# Patient Record
Sex: Male | Born: 1975 | Race: Black or African American | Hispanic: No | Marital: Married | Smoking: Never smoker
Health system: Southern US, Community
[De-identification: ages and names within clinical notes are randomized; demographics above are authoritative.]

## PROBLEM LIST (undated history)

## (undated) DIAGNOSIS — I10 Essential (primary) hypertension: Secondary | ICD-10-CM

## (undated) HISTORY — PX: ANKLE RECONSTRUCTION: SHX1151

---

## 2011-05-04 ENCOUNTER — Encounter: Payer: Self-pay | Admitting: *Deleted

## 2011-05-04 ENCOUNTER — Emergency Department (HOSPITAL_BASED_OUTPATIENT_CLINIC_OR_DEPARTMENT_OTHER)
Admission: EM | Admit: 2011-05-04 | Discharge: 2011-05-04 | Disposition: A | Discharge status: Home / Self Care | Payer: No Typology Code available for payment source | Attending: Emergency Medicine | Admitting: Emergency Medicine

## 2011-05-04 DIAGNOSIS — J45909 Unspecified asthma, uncomplicated: Secondary | ICD-10-CM | POA: Insufficient documentation

## 2011-05-04 DIAGNOSIS — R0602 Shortness of breath: Secondary | ICD-10-CM | POA: Insufficient documentation

## 2011-05-04 DIAGNOSIS — Y92410 Unspecified street and highway as the place of occurrence of the external cause: Secondary | ICD-10-CM | POA: Insufficient documentation

## 2011-05-04 DIAGNOSIS — IMO0002 Reserved for concepts with insufficient information to code with codable children: Secondary | ICD-10-CM | POA: Insufficient documentation

## 2011-05-04 DIAGNOSIS — T148XXA Other injury of unspecified body region, initial encounter: Secondary | ICD-10-CM

## 2011-05-04 MED ORDER — HYDROCODONE-ACETAMINOPHEN 5-325 MG PO TABS: 2.0000 | ORAL_TABLET | ORAL | Status: AC | PRN

## 2011-05-04 MED ORDER — IBUPROFEN 800 MG PO TABS: 800.0000 mg | ORAL_TABLET | Freq: Three times a day (TID) | ORAL | Status: AC

## 2011-05-04 NOTE — ED Provider Notes (Signed)
Medical screening examination/treatment/procedure(s) were performed by non-physician practitioner and as supervising physician I was immediately available for consultation/collaboration.   Andrew King, MD 05/04/11 1329 

## 2011-05-04 NOTE — ED Notes (Signed)
Karen Sofia, PA-C at bedside for evaluation. 
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
## 2018-05-09 ENCOUNTER — Encounter (HOSPITAL_BASED_OUTPATIENT_CLINIC_OR_DEPARTMENT_OTHER): Payer: Self-pay

## 2018-05-09 ENCOUNTER — Other Ambulatory Visit: Payer: Self-pay

## 2018-05-09 ENCOUNTER — Emergency Department (HOSPITAL_BASED_OUTPATIENT_CLINIC_OR_DEPARTMENT_OTHER)
Admission: EM | Admit: 2018-05-09 | Discharge: 2018-05-09 | Disposition: A | Discharge status: Home / Self Care | Payer: Self-pay | Attending: Emergency Medicine | Admitting: Emergency Medicine

## 2018-05-09 DIAGNOSIS — J45909 Unspecified asthma, uncomplicated: Secondary | ICD-10-CM | POA: Insufficient documentation

## 2018-05-09 DIAGNOSIS — Z79899 Other long term (current) drug therapy: Secondary | ICD-10-CM | POA: Insufficient documentation

## 2018-05-09 DIAGNOSIS — Y939 Activity, unspecified: Secondary | ICD-10-CM | POA: Insufficient documentation

## 2018-05-09 DIAGNOSIS — S40862A Insect bite (nonvenomous) of left upper arm, initial encounter: Secondary | ICD-10-CM | POA: Insufficient documentation

## 2018-05-09 DIAGNOSIS — R21 Rash and other nonspecific skin eruption: Secondary | ICD-10-CM | POA: Insufficient documentation

## 2018-05-09 DIAGNOSIS — Y929 Unspecified place or not applicable: Secondary | ICD-10-CM | POA: Insufficient documentation

## 2018-05-09 DIAGNOSIS — Y999 Unspecified external cause status: Secondary | ICD-10-CM | POA: Insufficient documentation

## 2018-05-09 DIAGNOSIS — S40869A Insect bite (nonvenomous) of unspecified upper arm, initial encounter: Secondary | ICD-10-CM

## 2018-05-09 DIAGNOSIS — S40811A Abrasion of right upper arm, initial encounter: Secondary | ICD-10-CM | POA: Insufficient documentation

## 2018-05-09 DIAGNOSIS — X58XXXA Exposure to other specified factors, initial encounter: Secondary | ICD-10-CM | POA: Insufficient documentation

## 2018-05-09 DIAGNOSIS — W57XXXA Bitten or stung by nonvenomous insect and other nonvenomous arthropods, initial encounter: Secondary | ICD-10-CM | POA: Insufficient documentation

## 2018-05-09 MED ORDER — DIPHENHYDRAMINE HCL 25 MG PO TABS: 25.0000 mg | ORAL_TABLET | Freq: Four times a day (QID) | ORAL | 0 refills | Status: DC | PRN

## 2018-05-09 MED ORDER — DIPHENHYDRAMINE HCL 25 MG PO CAPS
25.0000 mg | ORAL_CAPSULE | ORAL | Status: AC
  Administered 2018-05-09: 25 mg via ORAL
  Filled 2018-05-09: qty 1

## 2018-05-09 NOTE — ED Provider Notes (Signed)
[facility] EMERGENCY DEPARTMENT Provider Note   CSN: [id number] Arrival date & time: 05/09/18  0246     History   Chief Complaint Chief Complaint  Patient presents with  . Bites    HPI Leroy Hoffman is a 43 y.o. male.  The history is provided by the patient.  Rash  Location:  Torso and shoulder/arm Shoulder/arm rash location:  L forearm and R forearm Torso rash location:  Upper back Quality: itchiness   Quality: not blistering, not draining, not painful, not peeling and not red   Severity:  Mild Onset quality:  Sudden Timing:  Constant Progression:  Unchanged Chronicity:  New Context: insect bite/sting   Relieved by:  Nothing Worsened by:  Nothing Ineffective treatments:  None tried Associated symptoms: no abdominal pain, no fever, no induration and no shortness of breath   Would like to know if the 5 bites he has are bug bed bites or some other insect.  Had the bites a few weeks ago and they got a new bed etc.    Past Medical History:  Diagnosis Date  . Asthma     There are no active problems to display for this patient.   Past Surgical History:  Procedure Laterality Date  . ANKLE RECONSTRUCTION          Home Medications    Prior to Admission medications   Medication Sig Start Date End Date Taking? Authorizing Provider  albuterol (PROVENTIL HFA;VENTOLIN HFA) 108 (90 Base) MCG/ACT inhaler Inhale 1-2 puffs into the lungs every 6 (six) hours as needed for wheezing or shortness of breath.   Yes [provider]  atorvastatin (LIPITOR) 40 MG tablet Take 40 mg by mouth daily.   Yes [provider]  albuterol (PROVENTIL) (2.5 MG/3ML) 0.083% nebulizer solution Take 2.5 mg by nebulization every 6 (six) hours as needed.      [provider]  diphenhydrAMINE (BENADRYL) 25 MG tablet Take 1 tablet (25 mg total) by mouth every 6 (six) hours as needed. 05/09/18   Liliauna Santoni, MD  ipratropium (ATROVENT) 0.02 % nebulizer  solution Take 500 mcg by nebulization 4 (four) times daily.      [provider]    Family History No family history on file.  Social History Social History   Tobacco Use  . Smoking status: Never Smoker  . Smokeless tobacco: Never Used  Substance Use Topics  . Alcohol use: Yes    Comment: occ  . Drug use: Yes    Types: Marijuana     Allergies   Onion   Review of Systems Review of Systems  Constitutional: Negative for fever.  HENT: Negative for facial swelling.   Respiratory: Negative for shortness of breath.   Cardiovascular: Negative for chest pain.  Gastrointestinal: Negative for abdominal pain.  Genitourinary: Negative for hematuria.  Skin: Positive for rash.  All other systems reviewed and are negative.    Physical Exam Updated Vital Signs BP (!) 171/107 (BP Location: Left Arm)   Pulse 90   Temp 98.5 F (36.9 C) (Oral)   Resp 18   Ht 6' (1.829 m)   Wt 120.2 kg   SpO2 98%   BMI 35.94 kg/m   Physical Exam Constitutional:      Appearance: Normal appearance.  HENT:     Head: Normocephalic and atraumatic.     Nose: Nose normal.     Mouth/Throat:     Mouth: Mucous membranes are moist.  Eyes:  Pupils: Pupils are equal, round, and reactive to light.  Neck:     Musculoskeletal: Normal range of motion and neck supple.  Cardiovascular:     Rate and Rhythm: Normal rate and regular rhythm.     Pulses: Normal pulses.     Heart sounds: Normal heart sounds.  Pulmonary:     Effort: Pulmonary effort is normal.     Breath sounds: Normal breath sounds.  Abdominal:     General: Abdomen is flat. Bowel sounds are normal.     Palpations: Abdomen is soft.     Tenderness: There is no abdominal tenderness.  Musculoskeletal: Normal range of motion.  Skin:    General: Skin is warm and dry.     Capillary Refill: Capillary refill takes less than 2 seconds.     Comments: 5 wheals with central envenomation  Neurological:     General: No focal deficit  present.     Mental Status: He is alert and oriented to person, place, and time.  Psychiatric:        Mood and Affect: Mood normal.        Behavior: Behavior normal.      ED Treatments / Results  Labs (all labs ordered are listed, but only abnormal results are displayed) Labs Reviewed - No data to display  EKG None  Radiology No results found.  Procedures Procedures (including critical care time)  Medications Ordered in ED Medications  diphenhydrAMINE (BENADRYL) capsule 25 mg (has no administration in time range)       Final Clinical Impressions(s) / ED Diagnoses   Final diagnoses:  Insect bite of upper arm, unspecified laterality, initial encounter  Reaction to insect bite  Return for pain, intractable cough, productive cough,fevers >100.4 unrelieved by medication, shortness of breath, intractable vomiting, or diarrhea, abdominal pain, Inability to tolerate liquids or food, cough, altered mental status or any concerns. No signs of systemic illness or infection. The patient is nontoxic-appearing on exam and vital signs are within normal limits.   I have reviewed the triage vital signs and the nursing notes. Pertinent labs &imaging results that were available during my care of the patient were reviewed by me and considered in my medical decision making (see chart for details).  After history, exam, and medical workup I feel the patient has been appropriately medically screened and is safe for discharge home. Pertinent diagnoses were discussed with the patient. Patient was given return precautions.  ED Discharge Orders         Ordered    diphenhydrAMINE (BENADRYL) 25 MG tablet  Every 6 hours PRN     05/09/18 0328           Fidencia Mccloud, MD 05/09/18 0401

## 2018-05-09 NOTE — ED Triage Notes (Signed)
Patient c/o bites of unknown origin on his face, back and BUE X 2 to 3 weeks.

## 2018-05-22 ENCOUNTER — Emergency Department (HOSPITAL_BASED_OUTPATIENT_CLINIC_OR_DEPARTMENT_OTHER): Payer: No Typology Code available for payment source

## 2018-05-22 ENCOUNTER — Encounter (HOSPITAL_BASED_OUTPATIENT_CLINIC_OR_DEPARTMENT_OTHER): Payer: Self-pay | Admitting: Emergency Medicine

## 2018-05-22 ENCOUNTER — Other Ambulatory Visit: Payer: Self-pay

## 2018-05-22 ENCOUNTER — Emergency Department (HOSPITAL_BASED_OUTPATIENT_CLINIC_OR_DEPARTMENT_OTHER)
Admission: EM | Admit: 2018-05-22 | Discharge: 2018-05-22 | Disposition: A | Discharge status: Home / Self Care | Payer: No Typology Code available for payment source | Attending: Emergency Medicine | Admitting: Emergency Medicine

## 2018-05-22 DIAGNOSIS — I10 Essential (primary) hypertension: Secondary | ICD-10-CM | POA: Insufficient documentation

## 2018-05-22 DIAGNOSIS — Y999 Unspecified external cause status: Secondary | ICD-10-CM | POA: Diagnosis not present

## 2018-05-22 DIAGNOSIS — R0789 Other chest pain: Secondary | ICD-10-CM | POA: Insufficient documentation

## 2018-05-22 DIAGNOSIS — J45909 Unspecified asthma, uncomplicated: Secondary | ICD-10-CM | POA: Insufficient documentation

## 2018-05-22 DIAGNOSIS — Y939 Activity, unspecified: Secondary | ICD-10-CM | POA: Diagnosis not present

## 2018-05-22 DIAGNOSIS — Y929 Unspecified place or not applicable: Secondary | ICD-10-CM | POA: Insufficient documentation

## 2018-05-22 DIAGNOSIS — Z79899 Other long term (current) drug therapy: Secondary | ICD-10-CM | POA: Diagnosis not present

## 2018-05-22 DIAGNOSIS — S299XXA Unspecified injury of thorax, initial encounter: Secondary | ICD-10-CM | POA: Diagnosis present

## 2018-05-22 HISTORY — DX: Essential (primary) hypertension: I10

## 2018-05-22 MED ORDER — CYCLOBENZAPRINE HCL 10 MG PO TABS: 10.0000 mg | ORAL_TABLET | Freq: Two times a day (BID) | ORAL | 0 refills | Status: DC | PRN
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
## 2018-05-22 NOTE — Discharge Instructions (Signed)
Your x-ray today showed no evidence of rib fracture or significant chest injury.  We suspect you have soft tissue and muscular injury.  Please use the muscle relaxants help with your discomfort and follow-up with your primary doctor.  Please follow-up with a sports medicine physician if your symptoms continue.  If any symptoms change or worsen, please return to the nearest emergency department.
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
## 2018-07-03 ENCOUNTER — Emergency Department (HOSPITAL_BASED_OUTPATIENT_CLINIC_OR_DEPARTMENT_OTHER)
Admission: EM | Admit: 2018-07-03 | Discharge: 2018-07-03 | Disposition: A | Discharge status: Home / Self Care | Payer: Self-pay | Attending: Emergency Medicine | Admitting: Emergency Medicine

## 2018-07-03 ENCOUNTER — Emergency Department (HOSPITAL_BASED_OUTPATIENT_CLINIC_OR_DEPARTMENT_OTHER): Payer: Self-pay

## 2018-07-03 ENCOUNTER — Other Ambulatory Visit: Payer: Self-pay

## 2018-07-03 ENCOUNTER — Encounter (HOSPITAL_BASED_OUTPATIENT_CLINIC_OR_DEPARTMENT_OTHER): Payer: Self-pay | Admitting: Emergency Medicine

## 2018-07-03 DIAGNOSIS — J4541 Moderate persistent asthma with (acute) exacerbation: Secondary | ICD-10-CM | POA: Insufficient documentation

## 2018-07-03 DIAGNOSIS — R05 Cough: Secondary | ICD-10-CM | POA: Insufficient documentation

## 2018-07-03 DIAGNOSIS — I10 Essential (primary) hypertension: Secondary | ICD-10-CM | POA: Insufficient documentation

## 2018-07-03 DIAGNOSIS — Z79899 Other long term (current) drug therapy: Secondary | ICD-10-CM | POA: Insufficient documentation

## 2018-07-03 DIAGNOSIS — F1210 Cannabis abuse, uncomplicated: Secondary | ICD-10-CM | POA: Insufficient documentation

## 2018-07-03 MED ORDER — PREDNISONE 10 MG (21) PO TBPK: ORAL_TABLET | Freq: Every day | ORAL | 0 refills | Status: DC

## 2018-07-03 MED ORDER — PREDNISONE 50 MG PO TABS
60.0000 mg | ORAL_TABLET | Freq: Once | ORAL | Status: AC
  Administered 2018-07-03: 60 mg via ORAL
  Filled 2018-07-03: qty 1

## 2018-07-03 MED ORDER — ALBUTEROL SULFATE HFA 108 (90 BASE) MCG/ACT IN AERS: 1.0000 | INHALATION_SPRAY | Freq: Four times a day (QID) | RESPIRATORY_TRACT | 0 refills | Status: DC | PRN

## 2018-07-03 MED ORDER — ALBUTEROL SULFATE (2.5 MG/3ML) 0.083% IN NEBU
5.0000 mg | INHALATION_SOLUTION | Freq: Once | RESPIRATORY_TRACT | Status: AC
  Administered 2018-07-03: 5 mg via RESPIRATORY_TRACT
  Filled 2018-07-03: qty 6

## 2018-07-03 MED ORDER — IPRATROPIUM BROMIDE 0.02 % IN SOLN
0.5000 mg | Freq: Once | RESPIRATORY_TRACT | Status: AC
  Administered 2018-07-03: 0.5 mg via RESPIRATORY_TRACT
  Filled 2018-07-03: qty 2.5

## 2018-07-03 MED ORDER — HYDROCHLOROTHIAZIDE 25 MG PO TABS: 25.0000 mg | ORAL_TABLET | Freq: Every day | ORAL | 0 refills | Status: DC

## 2018-07-03 MED ORDER — ALBUTEROL (5 MG/ML) CONTINUOUS INHALATION SOLN
10.0000 mg/h | INHALATION_SOLUTION | Freq: Once | RESPIRATORY_TRACT | Status: AC
  Administered 2018-07-03: 10 mg/h via RESPIRATORY_TRACT
  Filled 2018-07-03: qty 20

## 2018-07-03 NOTE — ED Notes (Signed)
Pt ambulated in dept on room air SATS 89-92%, HR 120-125. No distress noted.

## 2018-07-03 NOTE — ED Notes (Signed)
RRT at bedside

## 2018-07-03 NOTE — ED Triage Notes (Signed)
Pt states that he has shortness of breath that has been waking him up for the past couple days. States he has been using his albuterol inhaler 8-9 times. Pt has history of asthma. Wheezing throughout

## 2018-07-03 NOTE — ED Notes (Signed)
Pt walking around in room with oxygen saturation 93-95%. EDP notified.

## 2018-07-03 NOTE — ED Provider Notes (Signed)
[facility] EMERGENCY DEPARTMENT Provider Note   CSN: [id number] Arrival date & time: 07/03/18  0277    History   Chief Complaint Chief Complaint  Patient presents with  . Shortness of Breath    HPI Leroy Hoffman is a 43 y.o. male.     Patient with history of asthma, hypertension, tobacco and marijuana abuse presenting with shortness of breath for the past week that is been progressively worsening over the past 2 or 3 days.  States he has been coughing and wheezing and coughing up clear mucus.  States his asthma is normally well controlled and he only uses albuterol on his as-needed basis.  He used it about 10 times in the past 1 day.  Denies any fevers, chills, nausea, vomiting.  No chest pain.  No runny nose or sore throat.  States he is not been admitted for his asthma for about 22 years.  Has been out of his blood pressure medication for the past 2 months after getting out of prison. Denies any leg pain or leg swelling.  The history is provided by the patient and a relative.  Shortness of Breath  Associated symptoms: cough   Associated symptoms: no abdominal pain, no chest pain, no headaches, no rash and no vomiting     Past Medical History:  Diagnosis Date  . Asthma   . Hypertension     There are no active problems to display for this patient.   Past Surgical History:  Procedure Laterality Date  . ANKLE RECONSTRUCTION          Home Medications    Prior to Admission medications   Medication Sig Start Date End Date Taking? Authorizing Provider  albuterol (PROVENTIL HFA;VENTOLIN HFA) 108 (90 Base) MCG/ACT inhaler Inhale 1-2 puffs into the lungs every 6 (six) hours as needed for wheezing or shortness of breath.    [provider]  albuterol (PROVENTIL) (2.5 MG/3ML) 0.083% nebulizer solution Take 2.5 mg by nebulization every 6 (six) hours as needed.      [provider]  atorvastatin (LIPITOR) 40 MG tablet Take 40 mg by mouth daily.     [provider]  cyclobenzaprine (FLEXERIL) 10 MG tablet Take 1 tablet (10 mg total) by mouth 2 (two) times daily as needed for muscle spasms. 05/22/18   Tegeler, Canary Brim, MD  diphenhydrAMINE (BENADRYL) 25 MG tablet Take 1 tablet (25 mg total) by mouth every 6 (six) hours as needed. 05/09/18   Palumbo, April, MD  ipratropium (ATROVENT) 0.02 % nebulizer solution Take 500 mcg by nebulization 4 (four) times daily.      [provider]    Family History No family history on file.  Social History Social History   Tobacco Use  . Smoking status: Never Smoker  . Smokeless tobacco: Never Used  Substance Use Topics  . Alcohol use: Yes    Comment: occ  . Drug use: Yes    Types: Marijuana     Allergies   Onion   Review of Systems Review of Systems  Constitutional: Negative for activity change and appetite change.  HENT: Positive for congestion. Negative for rhinorrhea.   Respiratory: Positive for cough and shortness of breath. Negative for chest tightness.   Cardiovascular: Negative for chest pain and leg swelling.  Gastrointestinal: Negative for abdominal pain, nausea and vomiting.  Genitourinary: Negative for dysuria and hematuria.  Musculoskeletal: Negative for arthralgias and myalgias.  Skin: Negative for rash.  Neurological: Negative for dizziness, weakness and  headaches.   all other systems are negative except as noted in the HPI and PMH.     Physical Exam Updated Vital Signs BP (!) 186/115 (BP Location: Right Arm)   Pulse (!) 123   Temp 99.2 F (37.3 C) (Oral)   Resp (!) 28   Ht 6' (1.829 m)   Wt 120.2 kg   SpO2 92%   BMI 35.94 kg/m   Physical Exam Vitals signs and nursing note reviewed.  Constitutional:      General: He is in acute distress.     Appearance: He is well-developed. He is obese.     Comments: Mild to moderate respiratory distress, speaking short sentences  HENT:     Head: Normocephalic and atraumatic.     Mouth/Throat:      Pharynx: No oropharyngeal exudate.  Eyes:     Conjunctiva/sclera: Conjunctivae normal.     Pupils: Pupils are equal, round, and reactive to light.  Neck:     Musculoskeletal: Normal range of motion and neck supple.     Comments: No meningismus. Cardiovascular:     Rate and Rhythm: Regular rhythm. Tachycardia present.     Heart sounds: Normal heart sounds. No murmur.  Pulmonary:     Effort: Respiratory distress present.     Breath sounds: No stridor. Wheezing present.     Comments: Increased work of breathing, diffuse inspiratory expiratory wheezing throughout Abdominal:     Palpations: Abdomen is soft.     Tenderness: There is no abdominal tenderness. There is no guarding or rebound.  Musculoskeletal: Normal range of motion.        General: No tenderness.  Skin:    General: Skin is warm.  Neurological:     Mental Status: He is alert and oriented to person, place, and time.     Cranial Nerves: No cranial nerve deficit.     Motor: No abnormal muscle tone.     Coordination: Coordination normal.     Comments:  5/5 strength throughout. CN 2-12 intact.Equal grip strength.   Psychiatric:        Behavior: Behavior normal.      ED Treatments / Results  Labs (all labs ordered are listed, but only abnormal results are displayed) Labs Reviewed - No data to display  EKG None  Radiology Dg Chest Portable 1 View  Result Date: 07/03/2018 CLINICAL DATA:  Shortness of breath for a few days. History of asthma. EXAM: PORTABLE CHEST 1 VIEW COMPARISON:  Chest radiograph May 22, 2018 FINDINGS: Cardiomediastinal silhouette is normal. No pleural effusions or focal consolidations. Trachea projects midline and there is no pneumothorax. Soft tissue planes and included osseous structures are non-suspicious. IMPRESSION: Negative. Electronically Signed   By: Awilda Metro M.D.   On: 07/03/2018 03:59    Procedures Procedures (including critical care time)  Medications Ordered in  ED Medications  albuterol (PROVENTIL) (2.5 MG/3ML) 0.083% nebulizer solution 5 mg (5 mg Nebulization Given 07/03/18 0325)  ipratropium (ATROVENT) nebulizer solution 0.5 mg (0.5 mg Nebulization Given 07/03/18 0325)  predniSONE (DELTASONE) tablet 60 mg (60 mg Oral Given 07/03/18 0342)  albuterol (PROVENTIL,VENTOLIN) solution continuous neb (10 mg/hr Nebulization Given 07/03/18 0344)  ipratropium (ATROVENT) nebulizer solution 0.5 mg (0.5 mg Nebulization Given 07/03/18 0344)     Initial Impression / Assessment and Plan / ED Course  I have reviewed the triage vital signs and the nursing notes.  Pertinent labs & imaging results that were available during my care of the patient were reviewed by me  and considered in my medical decision making (see chart for details).       Patient with shortness of breath with history of asthma and tobacco and marijuana abuse.  He is wheezing throughout on exam but not hypoxic.  He is tachypneic.  Patient given nebulizers and steroids and will obtain chest x-ray.  Chest x-ray negative for infiltrate or pneumothorax.  Patient denies chest pain.  After multiple nebulizers as well as p.o. steroids, patient feels much better and is moving air well.  His wheezing is improved as well as his work of breathing.  He is able to ambulate maintaining saturations with no hypoxia.  He states he feels much better and is ready to go home. BP improved.   Discussed needing to cease smoking.  We will also give course of steroids and refill his bronchodilators.  He is also requesting his blood pressure medication which is hydrochlorothiazide is not had for several months. Followup with PCP. Return precautions discussed.   CRITICAL CARE Performed by: Glynn Octave Total critical care time: 35 minutes Critical care time was exclusive of separately billable procedures and treating other patients. Critical care was necessary to treat or prevent imminent or life-threatening  deterioration. Critical care was time spent personally by me on the following activities: development of treatment plan with patient and/or surrogate as well as nursing, discussions with consultants, evaluation of patient's response to treatment, examination of patient, obtaining history from patient or surrogate, ordering and performing treatments and interventions, ordering and review of laboratory studies, ordering and review of radiographic studies, pulse oximetry and re-evaluation of patient's condition.  Final Clinical Impressions(s) / ED Diagnoses   Final diagnoses:  Moderate persistent asthma with exacerbation    ED Discharge Orders         Ordered    albuterol (PROVENTIL HFA;VENTOLIN HFA) 108 (90 Base) MCG/ACT inhaler  Every 6 hours PRN     07/03/18 0651    predniSONE (STERAPRED UNI-PAK 21 TAB) 10 MG (21) TBPK tablet  Daily     07/03/18 0651    hydrochlorothiazide (HYDRODIURIL) 25 MG tablet  Daily     07/03/18 0651           Glynn Octave, MD 07/03/18 [phone]

## 2018-07-03 NOTE — Discharge Instructions (Addendum)
Stop smoking.  Take the steroids as prescribed.  Use your inhaler every 4 hours for the next 2 or 3 days and then every 4 hours as needed.  Establish care with a primary doctor.  Return to the ED if you develop chest pain, worsening shortness of breath, or other concerns.

## 2018-07-03 NOTE — ED Notes (Signed)
ED Provider at bedside. 

## 2019-07-14 ENCOUNTER — Other Ambulatory Visit: Payer: Self-pay

## 2019-07-14 ENCOUNTER — Encounter (HOSPITAL_BASED_OUTPATIENT_CLINIC_OR_DEPARTMENT_OTHER): Payer: Self-pay | Admitting: *Deleted

## 2019-07-14 DIAGNOSIS — R062 Wheezing: Secondary | ICD-10-CM | POA: Insufficient documentation

## 2019-07-14 DIAGNOSIS — Z5321 Procedure and treatment not carried out due to patient leaving prior to being seen by health care provider: Secondary | ICD-10-CM | POA: Insufficient documentation

## 2019-07-14 MED ORDER — ALBUTEROL SULFATE HFA 108 (90 BASE) MCG/ACT IN AERS
4.0000 | INHALATION_SPRAY | Freq: Once | RESPIRATORY_TRACT | Status: AC
  Administered 2019-07-14: 4 via RESPIRATORY_TRACT
  Filled 2019-07-14: qty 6.7

## 2019-07-14 NOTE — ED Triage Notes (Signed)
Asthma. Wheezing since last night. He ran out of his inhaler yesterday. Wheezing audible.

## 2019-07-14 NOTE — Progress Notes (Signed)
Patient states that he is much better after having albuterol treatment.  Patient stated that he wanted to sign out and go home.  RT encourage patient to remain in the waiting room till he can see the doctor.

## 2019-07-15 ENCOUNTER — Emergency Department (HOSPITAL_BASED_OUTPATIENT_CLINIC_OR_DEPARTMENT_OTHER)
Admission: EM | Admit: 2019-07-15 | Discharge: 2019-07-15 | Discharge status: Against Medical Advice | Payer: Self-pay | Attending: Emergency Medicine | Admitting: Emergency Medicine

## 2019-07-15 NOTE — ED Notes (Signed)
Patient not in lobby when called to be roomed.

## 2019-07-24 ENCOUNTER — Encounter (HOSPITAL_BASED_OUTPATIENT_CLINIC_OR_DEPARTMENT_OTHER): Payer: Self-pay | Admitting: *Deleted

## 2019-07-24 ENCOUNTER — Emergency Department (HOSPITAL_BASED_OUTPATIENT_CLINIC_OR_DEPARTMENT_OTHER)
Admission: EM | Admit: 2019-07-24 | Discharge: 2019-07-24 | Disposition: A | Discharge status: Home / Self Care | Payer: Self-pay | Attending: Emergency Medicine | Admitting: Emergency Medicine

## 2019-07-24 ENCOUNTER — Emergency Department (HOSPITAL_BASED_OUTPATIENT_CLINIC_OR_DEPARTMENT_OTHER): Payer: Self-pay

## 2019-07-24 ENCOUNTER — Other Ambulatory Visit: Payer: Self-pay

## 2019-07-24 DIAGNOSIS — I10 Essential (primary) hypertension: Secondary | ICD-10-CM | POA: Insufficient documentation

## 2019-07-24 DIAGNOSIS — Z760 Encounter for issue of repeat prescription: Secondary | ICD-10-CM | POA: Insufficient documentation

## 2019-07-24 DIAGNOSIS — Z79899 Other long term (current) drug therapy: Secondary | ICD-10-CM | POA: Insufficient documentation

## 2019-07-24 DIAGNOSIS — J4541 Moderate persistent asthma with (acute) exacerbation: Secondary | ICD-10-CM | POA: Insufficient documentation

## 2019-07-24 MED ORDER — ALBUTEROL (5 MG/ML) CONTINUOUS INHALATION SOLN
10.0000 mg/h | INHALATION_SOLUTION | RESPIRATORY_TRACT | Status: AC
  Administered 2019-07-24: 10 mg/h via RESPIRATORY_TRACT
  Filled 2019-07-24: qty 20

## 2019-07-24 MED ORDER — DEXAMETHASONE 2 MG PO TABS: ORAL_TABLET | ORAL | 0 refills | Status: DC

## 2019-07-24 MED ORDER — DEXAMETHASONE SODIUM PHOSPHATE 10 MG/ML IJ SOLN
10.0000 mg | Freq: Once | INTRAMUSCULAR | Status: AC
  Administered 2019-07-24: 10 mg via INTRAMUSCULAR
  Filled 2019-07-24: qty 1

## 2019-07-24 MED ORDER — ALBUTEROL SULFATE (2.5 MG/3ML) 0.083% IN NEBU: 2.5000 mg | INHALATION_SOLUTION | Freq: Four times a day (QID) | RESPIRATORY_TRACT | 0 refills | Status: AC | PRN

## 2019-07-24 MED ORDER — MAGNESIUM SULFATE 2 GM/50ML IV SOLN
2.0000 g | Freq: Once | INTRAVENOUS | Status: AC
  Administered 2019-07-24: 2 g via INTRAVENOUS
  Filled 2019-07-24: qty 50

## 2019-07-24 MED ORDER — ALBUTEROL (5 MG/ML) CONTINUOUS INHALATION SOLN
10.0000 mg/h | INHALATION_SOLUTION | RESPIRATORY_TRACT | Status: AC
  Administered 2019-07-24: 10 mg/h via RESPIRATORY_TRACT

## 2019-07-24 MED ORDER — PULMICORT FLEXHALER 180 MCG/ACT IN AEPB: 2.0000 | INHALATION_SPRAY | Freq: Two times a day (BID) | RESPIRATORY_TRACT | 1 refills | Status: AC

## 2019-07-24 MED ORDER — SODIUM CHLORIDE 0.9 % IV SOLN
INTRAVENOUS | Status: DC | PRN
  Administered 2019-07-24: 04:00:00 500 mL via INTRAVENOUS

## 2019-07-24 MED ORDER — IPRATROPIUM BROMIDE 0.02 % IN SOLN
0.5000 mg | Freq: Once | RESPIRATORY_TRACT | Status: AC
  Administered 2019-07-24: 0.5 mg via RESPIRATORY_TRACT
  Filled 2019-07-24: qty 2.5

## 2019-07-24 MED ORDER — ALBUTEROL SULFATE (2.5 MG/3ML) 0.083% IN NEBU: 2.5000 mg | INHALATION_SOLUTION | Freq: Four times a day (QID) | RESPIRATORY_TRACT | 0 refills | Status: DC | PRN

## 2019-07-24 NOTE — ED Provider Notes (Signed)
[facility] Provider Note: Leroy Dell, MD, FACEP  CSN: [id number] MRN: [id number] ARRIVAL: 07/24/19 at 0232 ROOM: [facility]   CHIEF COMPLAINT  Shortness of Breath   HISTORY OF PRESENT ILLNESS  07/24/19 2:58 AM Leroy Hoffman is a 44 y.o. male with a history of asthma.  He has had an increase in shortness of breath and wheezing for the past week that worsened over the past day or 2.  Symptoms are now moderate to severe and not adequately relieved with regular albuterol inhaler use.  He has had a low-grade fever, 99.6 on arrival.  He was noted to be tachypneic and hypoxic on arrival.  He states his chest feels congested (tight) but not painful.   Past Medical History:  Diagnosis Date  . Asthma   . Hypertension     Past Surgical History:  Procedure Laterality Date  . ANKLE RECONSTRUCTION      No family history on file.  Social History   Tobacco Use  . Smoking status: Never Smoker  . Smokeless tobacco: Never Used  Substance Use Topics  . Alcohol use: Not Currently  . Drug use: Not Currently    Types: Marijuana    Prior to Admission medications   Medication Sig Start Date End Date Taking? Authorizing Provider  albuterol (PROVENTIL HFA;VENTOLIN HFA) 108 (90 Base) MCG/ACT inhaler Inhale 1-2 puffs into the lungs every 6 (six) hours as needed for wheezing or shortness of breath. 07/03/18   Rancour, Jeannett Senior, MD  albuterol (PROVENTIL) (2.5 MG/3ML) 0.083% nebulizer solution Take 3-6 mLs (2.5-5 mg total) by nebulization every 6 (six) hours as needed for wheezing or shortness of breath. 07/24/19   Alazar Cherian, MD  atorvastatin (LIPITOR) 40 MG tablet Take 40 mg by mouth daily.    [provider]  budesonide (PULMICORT FLEXHALER) 180 MCG/ACT inhaler Inhale 2 puffs into the lungs in the morning and at bedtime. 07/24/19   Marquet Faircloth, MD  dexamethasone (DECADRON) 2 MG tablet Take all 5 tablets on the morning of 07/25/2019. 07/24/19   Corrie Brannen, MD    hydrochlorothiazide (HYDRODIURIL) 25 MG tablet Take 1 tablet (25 mg total) by mouth daily. 07/03/18   Rancour, Jeannett Senior, MD  ipratropium (ATROVENT) 0.02 % nebulizer solution Take 500 mcg by nebulization 4 (four) times daily.      [provider]  diphenhydrAMINE (BENADRYL) 25 MG tablet Take 1 tablet (25 mg total) by mouth every 6 (six) hours as needed. 05/09/18 07/24/19  Palumbo, April, MD    Allergies Onion   REVIEW OF SYSTEMS  Negative except as noted here or in the History of Present Illness.   PHYSICAL EXAMINATION  Initial Vital Signs Blood pressure (!) 152/98, pulse (!) 120, temperature 99.6 F (37.6 C), temperature source Oral, resp. rate (!) 24, height 5\' 11"  (1.803 m), weight 114.8 kg, SpO2 (!) 89 %.  Examination General: Well-developed, well-nourished male in no acute distress; appearance consistent with age of record HENT: normocephalic; atraumatic Eyes: pupils equal, round and reactive to light; extraocular muscles intact Neck: supple Heart: regular rate and rhythm; tachycardia Lungs: Inspiratory and expiratory wheezes Abdomen: soft; nondistended; nontender; bowel sounds present Extremities: No deformity; full range of motion; pulses normal Neurologic: Awake, alert and oriented; motor function intact in all extremities and symmetric; no facial droop Skin: Warm and dry Psychiatric: Normal mood and affect   RESULTS  Summary of this visit's results, reviewed and interpreted by myself:   EKG Interpretation  Date/Time:    Ventricular Rate:  PR Interval:    QRS Duration:   QT Interval:    QTC Calculation:   R Axis:     Text Interpretation:        Laboratory Studies: No results found for this or any previous visit (from the past 24 hour(s)). Imaging Studies: DG Chest Port 1 View  Result Date: 07/24/2019 CLINICAL DATA:  Wheezing for 1 week EXAM: PORTABLE CHEST 1 VIEW COMPARISON:  07/03/2018 FINDINGS: The heart size and mediastinal contours are within  normal limits. Both lungs are clear. The visualized skeletal structures are unremarkable. IMPRESSION: No active disease. Electronically Signed   By: Inez Catalina M.D.   On: 07/24/2019 03:09    ED COURSE and MDM  Nursing notes, initial and subsequent vitals signs, including pulse oximetry, reviewed and interpreted by myself.  Vitals:   07/24/19 0335 07/24/19 0400 07/24/19 0415 07/24/19 0525  BP:      Pulse: (!) 108  (!) 109 (!) 108  Resp:    20  Temp:      TempSrc:      SpO2: 100% 92% 98% 92%  Weight:      Height:       Medications  albuterol (PROVENTIL,VENTOLIN) solution continuous neb (0 mg/hr Nebulization Stopped 07/24/19 0524)  albuterol (PROVENTIL,VENTOLIN) solution continuous neb (10 mg/hr Nebulization New Bag/Given 07/24/19 0359)  0.9 %  sodium chloride infusion (500 mLs Intravenous New Bag/Given 07/24/19 0426)  dexamethasone (DECADRON) injection 10 mg (10 mg Intramuscular Given 07/24/19 0342)  ipratropium (ATROVENT) nebulizer solution 0.5 mg (0.5 mg Nebulization Given 07/24/19 0259)  magnesium sulfate IVPB 2 g 50 mL (2 g Intravenous New Bag/Given 07/24/19 0426)   5:59 AM Patient's air movement has improved significantly and work of breathing decreased significantly after 2 continuous neb treatments.  He was also given dexamethasone 10 mg.  When conversing his oxygen saturation goes up to 96% but drops as low as 89% once at rest.  He states he is in no distress and feels like he is able to go home.  He is adamant he does not wish to be admitted at this time.  He has an adequate's supply of albuterol by inhaler at home but is requesting a refill of albuterol for his nebulizer machine.  We will also refill his Pulmicort.  He was advised to return if his breathing worsens and we would consider admission at that time.  He was advised to quit smoking.   PROCEDURES  Procedures   ED DIAGNOSES     ICD-10-CM   1. Moderate persistent asthma with exacerbation  J45.41   2. Medication refill   Z76.0        Ambers Iyengar, Jenny Reichmann, MD 07/24/19 [phone]

## 2019-07-24 NOTE — ED Triage Notes (Addendum)
Pt c/o wheezing that started one week ago. States temp 99.0 tonight. C/o "congestion" in chest. Last used albuterol pta. States inhaler is not helping. Audible wheezing on exam. No distress. Able to speak in complete sentences.
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
## 2020-06-01 IMAGING — DX DG CHEST 1V PORT
1 series · 1 of 1 positions shown · non-contrast
Comparison: Chest radiograph May 22, 2018

CLINICAL DATA: Shortness of breath for a few days. History of
asthma.

EXAM:
PORTABLE CHEST 1 VIEW

[chest ap]
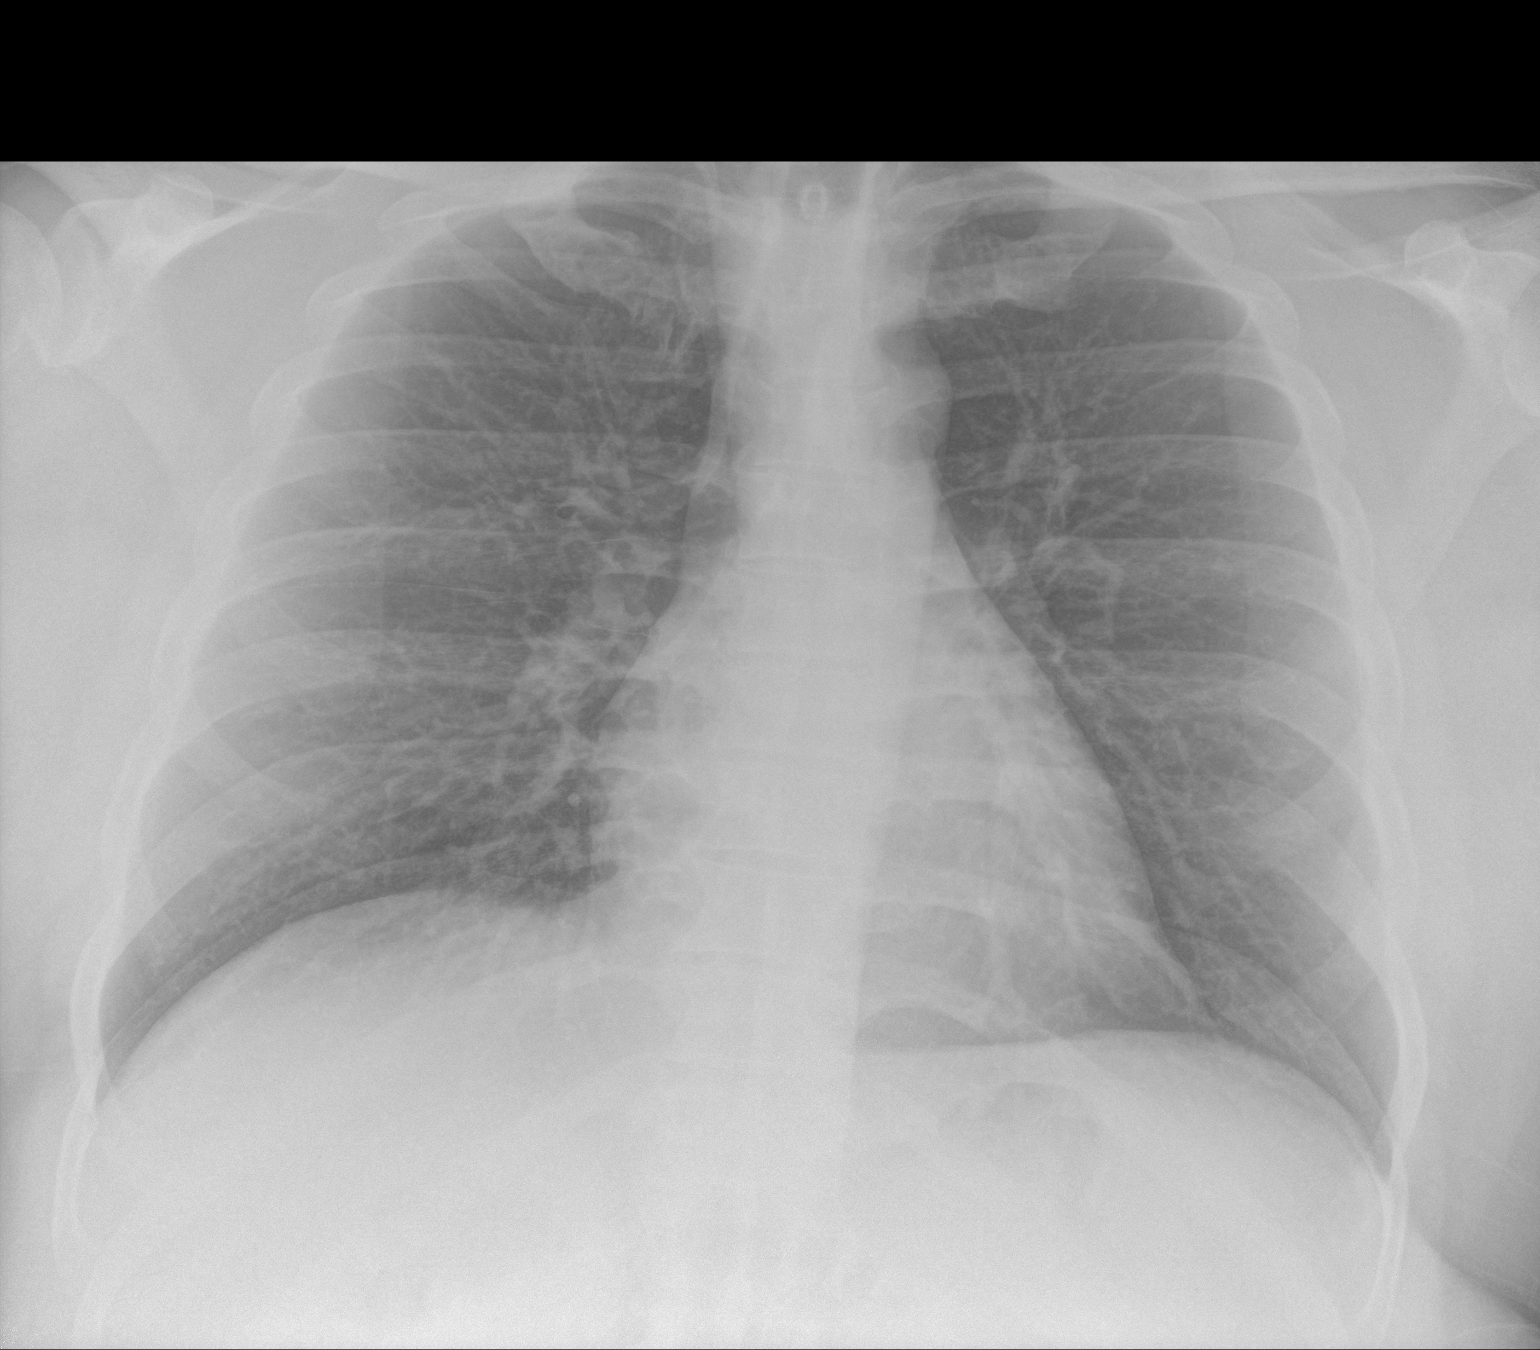

[1 of 1 positions shown; findings below may reference images not displayed]

FINDINGS: Cardiomediastinal silhouette is normal. No pleural effusions or
focal consolidations. Trachea projects midline and there is no
pneumothorax. Soft tissue planes and included osseous structures are
non-suspicious.
IMPRESSION: Negative.

## 2021-06-22 IMAGING — DX DG CHEST 1V PORT
1 series · 1 of 1 positions shown · non-contrast
Comparison: 07/03/2018

CLINICAL DATA: Wheezing for 1 week

EXAM:
PORTABLE CHEST 1 VIEW

[chest ap]
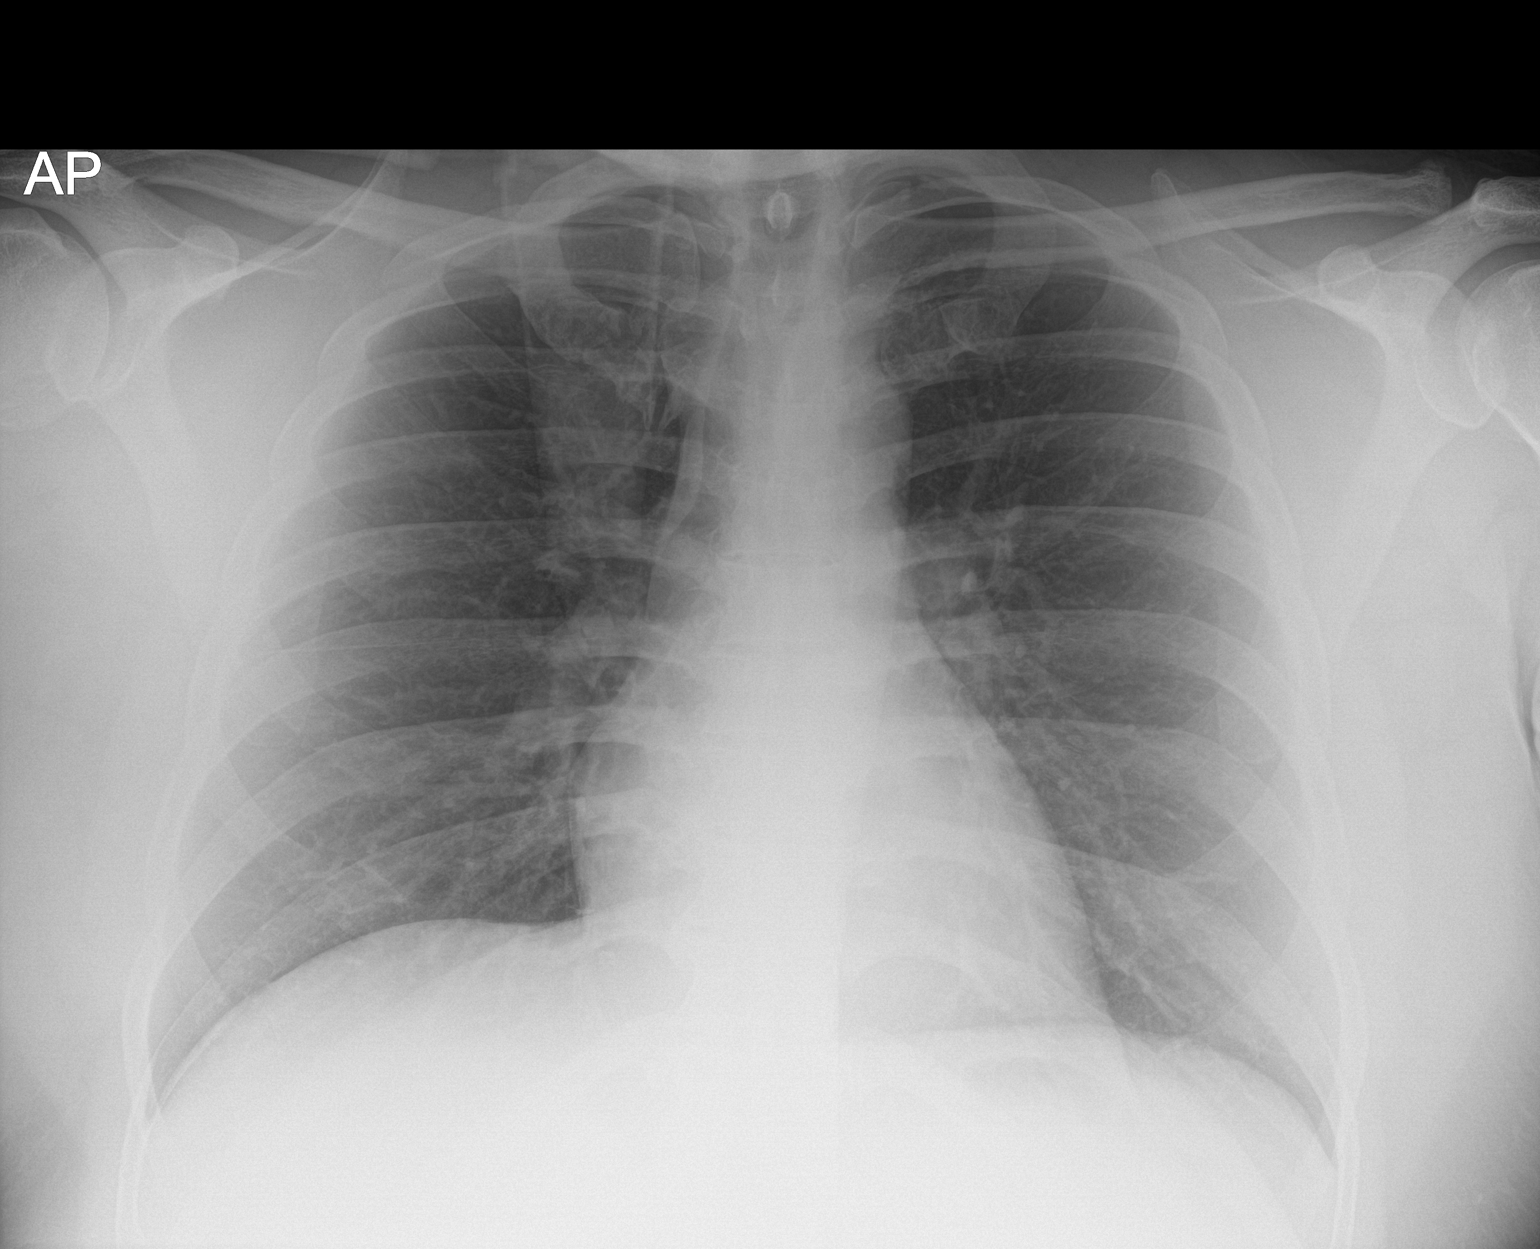

[1 of 1 positions shown; findings below may reference images not displayed]

FINDINGS: The heart size and mediastinal contours are within normal limits.
Both lungs are clear. The visualized skeletal structures are
unremarkable.
IMPRESSION: No active disease.

## 2021-08-07 ENCOUNTER — Encounter (HOSPITAL_BASED_OUTPATIENT_CLINIC_OR_DEPARTMENT_OTHER): Payer: Self-pay | Admitting: Emergency Medicine

## 2021-08-07 ENCOUNTER — Emergency Department (HOSPITAL_BASED_OUTPATIENT_CLINIC_OR_DEPARTMENT_OTHER): Payer: Self-pay

## 2021-08-07 ENCOUNTER — Emergency Department (HOSPITAL_BASED_OUTPATIENT_CLINIC_OR_DEPARTMENT_OTHER)
Admission: EM | Admit: 2021-08-07 | Discharge: 2021-08-07 | Disposition: A | Discharge status: Home / Self Care | Payer: Self-pay | Attending: Emergency Medicine | Admitting: Emergency Medicine

## 2021-08-07 ENCOUNTER — Other Ambulatory Visit: Payer: Self-pay

## 2021-08-07 ENCOUNTER — Other Ambulatory Visit (HOSPITAL_BASED_OUTPATIENT_CLINIC_OR_DEPARTMENT_OTHER): Payer: Self-pay

## 2021-08-07 DIAGNOSIS — J4541 Moderate persistent asthma with (acute) exacerbation: Secondary | ICD-10-CM | POA: Insufficient documentation

## 2021-08-07 DIAGNOSIS — Z79899 Other long term (current) drug therapy: Secondary | ICD-10-CM | POA: Insufficient documentation

## 2021-08-07 DIAGNOSIS — I10 Essential (primary) hypertension: Secondary | ICD-10-CM | POA: Insufficient documentation

## 2021-08-07 DIAGNOSIS — Z7951 Long term (current) use of inhaled steroids: Secondary | ICD-10-CM | POA: Insufficient documentation

## 2021-08-07 LAB — COMPREHENSIVE METABOLIC PANEL
ALT: 55 U/L — ABNORMAL HIGH (ref 0–44)
AST: 29 U/L (ref 15–41)
Albumin: 4.2 g/dL (ref 3.5–5.0)
Alkaline Phosphatase: 64 U/L (ref 38–126)
Anion gap: 9 (ref 5–15)
BUN: 14 mg/dL (ref 6–20)
CO2: 28 mmol/L (ref 22–32)
Calcium: 9.3 mg/dL (ref 8.9–10.3)
Chloride: 103 mmol/L (ref 98–111)
Creatinine, Ser: 1.1 mg/dL (ref 0.61–1.24)
GFR, Estimated: >60 mL/min (ref >60)
Glucose, Bld: 160 mg/dL — ABNORMAL HIGH (ref 70–99)
Potassium: 3.8 mmol/L (ref 3.5–5.1)
Sodium: 140 mmol/L (ref 135–145)
Total Bilirubin: 0.9 mg/dL (ref 0.3–1.2)
Total Protein: 8.3 g/dL — ABNORMAL HIGH (ref 6.5–8.1)

## 2021-08-07 LAB — CBC
HCT: 47.7 % (ref 39.0–52.0)
Hemoglobin: 15.2 g/dL (ref 13.0–17.0)
MCH: 27.6 pg (ref 26.0–34.0)
MCHC: 31.9 g/dL (ref 30.0–36.0)
MCV: 86.7 fL (ref 80.0–100.0)
Platelets: 360 10*3/uL (ref 150–400)
RBC: 5.5 MIL/uL (ref 4.22–5.81)
RDW: 12.9 % (ref 11.5–15.5)
WBC: 12.5 10*3/uL — ABNORMAL HIGH (ref 4.0–10.5)
nRBC: 0 % (ref 0.0–0.2)

## 2021-08-07 MED ORDER — ALBUTEROL SULFATE (2.5 MG/3ML) 0.083% IN NEBU
5.0000 mg | INHALATION_SOLUTION | Freq: Once | RESPIRATORY_TRACT | Status: AC
  Administered 2021-08-07: 5 mg via RESPIRATORY_TRACT

## 2021-08-07 MED ORDER — METHYLPREDNISOLONE SODIUM SUCC 125 MG IJ SOLR
125.0000 mg | Freq: Once | INTRAMUSCULAR | Status: AC
Start: 2021-08-07 — End: 2021-08-07
  Administered 2021-08-07: 125 mg via INTRAVENOUS
  Filled 2021-08-07: qty 2

## 2021-08-07 MED ORDER — FLUTICASONE PROPIONATE HFA 44 MCG/ACT IN AERO
2.0000 | INHALATION_SPRAY | Freq: Once | RESPIRATORY_TRACT | Status: AC
  Administered 2021-08-07: 2 via RESPIRATORY_TRACT
  Filled 2021-08-07: qty 10.6

## 2021-08-07 MED ORDER — ALBUTEROL SULFATE (2.5 MG/3ML) 0.083% IN NEBU
INHALATION_SOLUTION | RESPIRATORY_TRACT | Status: AC
  Filled 2021-08-07: qty 3

## 2021-08-07 MED ORDER — ALBUTEROL SULFATE HFA 108 (90 BASE) MCG/ACT IN AERS
2.0000 | INHALATION_SPRAY | Freq: Once | RESPIRATORY_TRACT | Status: AC
  Administered 2021-08-07: 2 via RESPIRATORY_TRACT
  Filled 2021-08-07: qty 6.7

## 2021-08-07 MED ORDER — IPRATROPIUM-ALBUTEROL 0.5-2.5 (3) MG/3ML IN SOLN
3.0000 mL | Freq: Once | RESPIRATORY_TRACT | Status: AC
  Administered 2021-08-07: 3 mL via RESPIRATORY_TRACT
  Filled 2021-08-07: qty 3

## 2021-08-07 MED ORDER — ALBUTEROL (5 MG/ML) CONTINUOUS INHALATION SOLN
INHALATION_SOLUTION | RESPIRATORY_TRACT | Status: AC
  Filled 2021-08-07: qty 0.5

## 2021-08-07 MED ORDER — PREDNISONE 10 MG PO TABS
40.0000 mg | ORAL_TABLET | Freq: Every day | ORAL | 0 refills | Status: AC
  Filled 2021-08-07: qty 16, 4d supply, fill #0

## 2021-08-07 NOTE — Progress Notes (Signed)
Transition of Care [facility]) - Emergency Department Mini Assessment ? ? ?Patient Details  ?Name: Leroy Hoffman ?MRN: [id number] ?Date of Birth: Dec 05, 1975 ? ?Transition of Care (TOC) CM/SW Contact:    ?Roseanne Kaufman, RN ?Phone Number: ?08/07/2021, 1:44 PM ? ? ?Clinical Narrative: ? ?RNCM called patient's cell# [phone] with no answer. RNCM called nursing station at [facility] and spoke with charge nurse who states patient was still in the room and wife ignored call, ok for RNCM to call patient's cell# now.  ?RNCM called patient's cell# [phone] receiving message " welcome to message center, please enter your mailbox number"  ? ? ?ED Mini Assessment: ?  ?RNCM received TOC consult for PCP needs.  ?  ? ?Patient Contact and Communications ? Patient: [phone] ?  ? ?Admission diagnosis:  shortness of breath ?There are no problems to display for this patient. ? ?PCP:  Patient, No Pcp Per (Inactive) ?Pharmacy:   ?[id number] [place] [place] ?[address] ?[place] [address] ?Phone: [phone] Fax: [phone] ? ?[facility] ?[address] [place] [address] ?Phone: [phone] Fax: [phone] ?  ?

## 2021-08-07 NOTE — ED Triage Notes (Signed)
Pt here from home with c/o cough and sob , pt has been using his inhaler but not been working  ?

## 2021-08-07 NOTE — ED Notes (Signed)
Pt sts he feels much better after neb ?

## 2021-08-07 NOTE — ED Provider Notes (Signed)
?[facility] EMERGENCY DEPARTMENT ?Provider Note ? ? ?CSN: [id number] ?Arrival date & time: 08/07/21  [id number] ? ?  ? ?History ? ?No chief complaint on file. ? ? ?Leroy Hoffman is a 46 y.o. male. ? ?HPI ? ?  ?46yo male with history of asthma and hypertension presents with concern for shortness of breath. ? ?Feels like past asthma exacerbations, has ED visits usually in the spring, may have allergies ?For the past few night has had dyspnea when laying down, better sitting up ?Some dyspnea with going up steps ?No chest pain ?No leg pain or swelling ?Cough, coughing up brown sputum in AM is more green, for a few days ?Little bit of congestion/runny nose ?No sore throat, fever, n/v/d/black or bloody stools ?Was wheezing , taking treatments at home and didn't feel relief with inhaler, felt better after treatment here ?Shortness of breath does feel like asthma. Recognizes should stop smoking but has yet to, no other sig etoh/drugs ?No fam hx of early CAD  ? ?No long trips/immobilization/surgeries. No fam hx of DVT/PE ? ? ?Past Medical History:  ?Diagnosis Date  ? Asthma   ? Hypertension   ?  ? ?Home Medications ?Prior to Admission medications   ?Medication Sig Start Date End Date Taking? Authorizing Provider  ?predniSONE (DELTASONE) 10 MG tablet Take 4 tablets (40 mg total) by mouth daily for 4 days. 08/07/21 08/11/21 Yes Alvira MondaySchlossman, Geo Slone, MD  ?albuterol (PROVENTIL HFA;VENTOLIN HFA) 108 (90 Base) MCG/ACT inhaler Inhale 1-2 puffs into the lungs every 6 (six) hours as needed for wheezing or shortness of breath. 07/03/18   Rancour, Jeannett SeniorStephen, MD  ?albuterol (PROVENTIL) (2.5 MG/3ML) 0.083% nebulizer solution Take 3-6 mLs (2.5-5 mg total) by nebulization every 6 (six) hours as needed for wheezing or shortness of breath. 07/24/19   Molpus, John, MD  ?atorvastatin (LIPITOR) 40 MG tablet Take 40 mg by mouth daily.    [provider]  ?budesonide (PULMICORT FLEXHALER) 180 MCG/ACT inhaler Inhale 2 puffs into the lungs in  the morning and at bedtime. 07/24/19   Molpus, John, MD  ?dexamethasone (DECADRON) 2 MG tablet Take all 5 tablets on the morning of 07/25/2019. 07/24/19   Molpus, John, MD  ?hydrochlorothiazide (HYDRODIURIL) 25 MG tablet Take 1 tablet (25 mg total) by mouth daily. 07/03/18   Rancour, Jeannett SeniorStephen, MD  ?ipratropium (ATROVENT) 0.02 % nebulizer solution Take 500 mcg by nebulization 4 (four) times daily.      [provider]  ?diphenhydrAMINE (BENADRYL) 25 MG tablet Take 1 tablet (25 mg total) by mouth every 6 (six) hours as needed. 05/09/18 07/24/19  Palumbo, April, MD  ?   ? ?Allergies    ?Onion   ? ?Review of Systems   ?Review of Systems ? ?Physical Exam ?Updated Vital Signs ?BP (!) 163/111   Pulse 86   Temp 98 ?F (36.7 ?C)   Resp (!) 24   SpO2 (!) 89%  ?Physical Exam ?Vitals and nursing note reviewed.  ?Constitutional:   ?   General: He is not in acute distress. ?   Appearance: He is well-developed. He is not diaphoretic.  ?HENT:  ?   Head: Normocephalic and atraumatic.  ?Eyes:  ?   Conjunctiva/sclera: Conjunctivae normal.  ?Cardiovascular:  ?   Rate and Rhythm: Normal rate and regular rhythm.  ?   Heart sounds: Normal heart sounds. No murmur heard. ?  No friction rub. No gallop.  ?Pulmonary:  ?   Effort: Pulmonary effort is normal. No respiratory distress.  ?  Breath sounds: Wheezing (diffuse inspiratory and experiatory) present. No rales.  ?Abdominal:  ?   General: There is no distension.  ?   Palpations: Abdomen is soft.  ?   Tenderness: There is no abdominal tenderness. There is no guarding.  ?Musculoskeletal:  ?   Cervical back: Normal range of motion.  ?   Right lower leg: No edema.  ?   Left lower leg: No edema.  ?Skin: ?   General: Skin is warm and dry.  ?Neurological:  ?   Mental Status: He is alert and oriented to person, place, and time.  ? ? ?ED Results / Procedures / Treatments   ?Labs ?(all labs ordered are listed, but only abnormal results are displayed) ?Labs Reviewed  ?CBC - Abnormal; Notable for  the following components:  ?    Result Value  ? WBC 12.5 (*)   ? All other components within normal limits  ?COMPREHENSIVE METABOLIC PANEL - Abnormal; Notable for the following components:  ? Glucose, Bld 160 (*)   ? Total Protein 8.3 (*)   ? ALT 55 (*)   ? All other components within normal limits  ? ? ?EKG ?None ? ?Radiology ?DG Chest 2 View ? ?Result Date: 08/07/2021 ?CLINICAL DATA:  Shortness of breath, cough EXAM: CHEST - 2 VIEW COMPARISON:  07/24/2019 FINDINGS: The heart size and mediastinal contours are within normal limits. Both lungs are clear. The visualized skeletal structures are unremarkable. IMPRESSION: No active cardiopulmonary disease. Electronically Signed   By: Ernie Avena M.D.   On: 08/07/2021 10:52   ? ?Procedures ?Procedures  ? ? ?Medications Ordered in ED ?Medications  ?albuterol (VENTOLIN) (5 MG/ML) 0.5% continuous inhalation solution (  Return to [facility] 08/07/21 1404)  ?albuterol (PROVENTIL) (2.5 MG/3ML) 0.083% nebulizer solution 5 mg ( Nebulization Not Given 08/07/21 1033)  ?methylPREDNISolone sodium succinate (SOLU-MEDROL) 125 mg/2 mL injection 125 mg (125 mg Intravenous Given 08/07/21 1212)  ?ipratropium-albuterol (DUONEB) 0.5-2.5 (3) MG/3ML nebulizer solution 3 mL (3 mLs Nebulization Given 08/07/21 1201)  ?fluticasone (FLOVENT HFA) 44 MCG/ACT inhaler 2 puff (2 puffs Inhalation Given 08/07/21 1345)  ?albuterol (VENTOLIN HFA) 108 (90 Base) MCG/ACT inhaler 2 puff (2 puffs Inhalation Given 08/07/21 1344)  ? ? ?ED Course/ Medical Decision Making/ A&P ?Clinical Course as of 08/07/21 2209  ?Thu Aug 07, 2021  ?1225 Anion gap: 9 [GL]  ?  ?Clinical Course User Index ?[GL] Loeffler, Finis Bud, PA-C  ? ?                        ?Medical Decision Making ?Amount and/or Complexity of Data Reviewed ?Labs: ordered. ?Radiology: ordered. ? ?Risk ?Prescription drug management. ? ? ? ?46yo male with history of asthma and hypertension presents with concern for shortness of breath. ? ?Differential diagnosis for  dyspnea includes ACS, PE, COPD/asthma exacerbation, CHF exacerbation, anemia, pneumonia, viral etiology such as COVID 19 infection, metabolic abnormality.   ? ?Chest x-ray was done and personally evaluated by me which showed no pneumonia or pneumothorax. EKG was evaluated by me which showed no acute abnormalities.  No leg swelling, no JVD, normal CXR, doubt CHF.  Denies CP, doubt ACS.   Have low suspicion for PE in setting of symptoms consistent with prior asthma exacerbations.  ? ?Arrives to the ED with significant wheezing on exam, given solumedrol, nebulizers with significant improvement. Given additional neb with continued improvement and feels back to baseline.  While sleeping, O2 levels decreased to 88-89%, reports he was diagnosed  with suspected sleep apnea and was supposed to have follow up for continued evaluation as he does not have official diagnosis but has been unable to have this evaluated yet.  Wife reports she is concerned he has sleep apnea as well.  When awake, his O2 is improved.  Discussed possibility of obtaining ddimer for further evaluation, however he feels symptoms are consistent with prior asthma exacerbations, and agree to forego ddimer testing.   ? ?Given flovent inhaler, albuterol inhaler, rx for prednisone. Placed TOC consult for PCP and referral to pulmonology. Patient discharged in stable condition with understanding of reasons to return.  ? ? ? ? ? ? ? ? ? ?Final Clinical Impression(s) / ED Diagnoses ?Final diagnoses:  ?Moderate persistent asthma with exacerbation  ? ? ?Rx / DC Orders ?ED Discharge Orders   ? ?      Ordered  ?  Ambulatory referral to Pulmonology       ? 08/07/21 1342  ?  predniSONE (DELTASONE) 10 MG tablet  Daily       ? 08/07/21 1343  ? ?  ?  ? ?  ? ? ?  ?Alvira Monday, MD ?08/08/21 [phone] ? ?

## 2023-01-04 ENCOUNTER — Ambulatory Visit: Payer: Self-pay | Admitting: Internal Medicine

## 2024-01-17 ENCOUNTER — Ambulatory Visit: Payer: Self-pay

## 2024-01-17 NOTE — Telephone Encounter (Signed)
 Leroy Hoffman, see message.

## 2024-01-17 NOTE — Telephone Encounter (Signed)
 FYI Only or Action Required?: FYI only for provider.  Patient was last seen in primary care on N/A, new patient.  Called Nurse Triage reporting Pruritis and Tinea.  Symptoms began several years ago.  Interventions attempted: OTC medications: anti jock itch/athletes foot spray.  Symptoms are: bottoms of scrotum white rash that looks like ringworm with itchiness and burns after scratching gradually worsening.  Triage Disposition: See PCP When Office is Open (Within 3 Days)- Wife is agreeable to mobile medicine clinic tomorrow  Patient/caregiver understands and will follow disposition?:  Yes           Copied from CRM [id number]. Topic: Clinical - Red Word Triage >> Jan 17, 2024  3:10 PM Harlene ORN wrote: Red Word that prompted transfer to Nurse Triage:  jock itch getting worse. says it looks like ringworm. Reason for Disposition  [1] Using home treatment > 1 week for jock itch AND [2] not improved  Answer Assessment - Initial Assessment Questions Called CAL due to Epic denying scheduling patient for new patient appointment as he was discharged from practice. CAL states to send CRM message so they can investigate. Wife verbalizes understanding that patient can not be scheduled at this time. She adds that he needs to be treated for Trichomonas. Advised patient go to mobile medicine clinic, urgent care or ED. She is agreeable to mobile medicine clinic tomorrow.     1. APPEARANCE of RASH: What does the rash look like?      Looks like ringworm, circular about pea sized and white colored, looks like dry skin.   2. LOCATION: Where is the rash located?      Bottom of scrotum.  3. ONSET: When did the rash start?      Rash started after using spray (Jock Itch/Athlete Foot spray), 2 years. Worsening.  4. ITCHING: Does the rash itch? If Yes, ask: How bad is the itch?  (Scale 1-10; or mild, moderate, severe)     Yes, severe. He states he will scratch it to the point it  bleeds.  5. PAIN: Does the rash hurt? If Yes, ask: How bad is the pain?  (Scale 1-10; or mild, moderate, severe)     He states only burning if he scratches too much.  6. OTHER SYMPTOMS: Do you have any other symptoms? (e.g., fever)     Denies fever, rash elsewhere.  7. PREGNANCY: Is there any chance you are pregnant? When was your last menstrual period?     N/A.  Protocols used: Jock Itch-A-AH

## 2024-01-20 NOTE — Telephone Encounter (Signed)
 Per our practice policy, patient has been blocked from scheduling another new patient appointment with our practice due to no showing a new patient appt on 01/04/23.  I have asked for E2C2 to schedule patient with another practice.

## 2024-01-21 NOTE — Telephone Encounter (Signed)
 Noted

## 2024-01-31 ENCOUNTER — Ambulatory Visit (HOSPITAL_COMMUNITY)

## 2024-02-01 ENCOUNTER — Ambulatory Visit (HOSPITAL_COMMUNITY)
Admission: RE | Admit: 2024-02-01 | Discharge: 2024-02-01 | Disposition: A | Discharge status: Home / Self Care | Source: Ambulatory Visit | Attending: Emergency Medicine | Admitting: Emergency Medicine

## 2024-02-01 ENCOUNTER — Other Ambulatory Visit (HOSPITAL_COMMUNITY): Payer: Self-pay

## 2024-02-01 ENCOUNTER — Encounter (HOSPITAL_COMMUNITY): Payer: Self-pay

## 2024-02-01 VITALS — BP 168/123 | HR 84 | Temp 98.4°F | Resp 16

## 2024-02-01 DIAGNOSIS — I10 Essential (primary) hypertension: Secondary | ICD-10-CM | POA: Insufficient documentation

## 2024-02-01 DIAGNOSIS — Z202 Contact with and (suspected) exposure to infections with a predominantly sexual mode of transmission: Secondary | ICD-10-CM | POA: Insufficient documentation

## 2024-02-01 DIAGNOSIS — B353 Tinea pedis: Secondary | ICD-10-CM | POA: Insufficient documentation

## 2024-02-01 DIAGNOSIS — J45909 Unspecified asthma, uncomplicated: Secondary | ICD-10-CM | POA: Insufficient documentation

## 2024-02-01 LAB — CBC
HCT: 48 % (ref 39.0–52.0)
Hemoglobin: 14.7 g/dL (ref 13.0–17.0)
MCH: 27 pg (ref 26.0–34.0)
MCHC: 30.6 g/dL (ref 30.0–36.0)
MCV: 88.2 fL (ref 80.0–100.0)
Platelets: 338 K/uL (ref 150–400)
RBC: 5.44 MIL/uL (ref 4.22–5.81)
RDW: 12.5 % (ref 11.5–15.5)
WBC: 9.9 K/uL (ref 4.0–10.5)
nRBC: 0 % (ref 0.0–0.2)

## 2024-02-01 LAB — COMPREHENSIVE METABOLIC PANEL WITH GFR
ALT: 56 U/L — ABNORMAL HIGH (ref 0–44)
AST: 29 U/L (ref 15–41)
Albumin: 3.8 g/dL (ref 3.5–5.0)
Alkaline Phosphatase: 66 U/L (ref 38–126)
Anion gap: 13 (ref 5–15)
BUN: 13 mg/dL (ref 6–20)
CO2: 26 mmol/L (ref 22–32)
Calcium: 9.2 mg/dL (ref 8.9–10.3)
Chloride: 99 mmol/L (ref 98–111)
Creatinine, Ser: 1.16 mg/dL (ref 0.61–1.24)
GFR, Estimated: >60 mL/min (ref >60)
Glucose, Bld: 231 mg/dL — ABNORMAL HIGH (ref 70–99)
Potassium: 4.4 mmol/L (ref 3.5–5.1)
Sodium: 138 mmol/L (ref 135–145)
Total Bilirubin: 1.2 mg/dL (ref 0.0–1.2)
Total Protein: 7.5 g/dL (ref 6.5–8.1)

## 2024-02-01 LAB — HIV ANTIBODY (ROUTINE TESTING W REFLEX): HIV Screen 4th Generation wRfx: NONREACTIVE

## 2024-02-01 MED ORDER — HYDROCHLOROTHIAZIDE 25 MG PO TABS
25.0000 mg | ORAL_TABLET | Freq: Every day | ORAL | 0 refills | Status: AC
  Filled 2024-02-01: qty 30, 30d supply, fill #0

## 2024-02-01 MED ORDER — METRONIDAZOLE 500 MG PO TABS
2000.0000 mg | ORAL_TABLET | Freq: Once | ORAL | 0 refills | Status: AC
  Filled 2024-02-01: qty 4, 1d supply, fill #0

## 2024-02-01 MED ORDER — ALBUTEROL SULFATE HFA 108 (90 BASE) MCG/ACT IN AERS
1.0000 | INHALATION_SPRAY | Freq: Four times a day (QID) | RESPIRATORY_TRACT | 1 refills | Status: AC | PRN
  Filled 2024-02-01: qty 6.7, 20d supply, fill #0

## 2024-02-01 MED ORDER — FLUCONAZOLE 150 MG PO TABS
150.0000 mg | ORAL_TABLET | ORAL | 0 refills | Status: AC
  Filled 2024-02-01: qty 4, 28d supply, fill #0

## 2024-02-01 MED ORDER — MICONAZOLE NITRATE 2 % EX CREA
1.0000 | TOPICAL_CREAM | Freq: Two times a day (BID) | CUTANEOUS | 1 refills | Status: AC
  Filled 2024-02-01 (×2): qty 60, 30d supply, fill #0

## 2024-02-01 NOTE — ED Provider Notes (Signed)
 [facility]    CSN: [id number] Arrival date & time: 02/01/24  1123      History   Chief Complaint Chief Complaint  Patient presents with   SEXUALLY TRANSMITTED DISEASE   Hypertension    HPI Leroy Hoffman is a 48 y.o. male.   He is here today because his wife tested positive for trichomonas, and he is requesting treatment. He does not have penile discharge or any GU symptoms.   He also has elevated BP today. Says he has a hx of HTN but has not been on meds for about 3 years. Used to take hydrochlorothiazide . Would be interested in taking it again. Also has severe athlete's foot, OTC treatment not helping.   Has hx asthma, does not currently have an inhaler. No SOB or wheezing at this time, he would like an inhaler to have in case he needs it.   No medicaid yet, hasn't applied, he doesn't think he is eligible.     Hypertension    Past Medical History:  Diagnosis Date   Asthma    Hypertension     There are no active problems to display for this patient.   Past Surgical History:  Procedure Laterality Date   ANKLE RECONSTRUCTION         Home Medications    Prior to Admission medications   Medication Sig Start Date End Date Taking? Authorizing Provider  fluconazole (DIFLUCAN) 150 MG tablet Take 1 tablet (150 mg total) by mouth once a week. 02/01/24  Yes Richad Jon HERO, NP  hydrochlorothiazide  (HYDRODIURIL ) 25 MG tablet Take 1 tablet (25 mg total) by mouth daily. 02/01/24  Yes Richad Jon HERO, NP  miconazole (MICOTIN) 2 % cream Apply 1 Application topically 2 (two) times daily. 02/01/24  Yes Richad Jon HERO, NP  albuterol  (PROVENTIL ) (2.5 MG/3ML) 0.083% nebulizer solution Take 3-6 mLs (2.5-5 mg total) by nebulization every 6 (six) hours as needed for wheezing or shortness of breath. 07/24/19   Molpus, Norleen, MD  albuterol  (VENTOLIN  HFA) 108 (90 Base) MCG/ACT inhaler Inhale 1-2 puffs into the lungs every 6 (six) hours as needed for wheezing or shortness  of breath. 02/01/24   Richad Jon HERO, NP  atorvastatin (LIPITOR) 40 MG tablet Take 40 mg by mouth daily.    [provider]  budesonide  (PULMICORT  FLEXHALER) 180 MCG/ACT inhaler Inhale 2 puffs into the lungs in the morning and at bedtime. Patient not taking: Reported on 02/01/2024 07/24/19   Molpus, John, MD  ipratropium (ATROVENT ) 0.02 % nebulizer solution Take 500 mcg by nebulization 4 (four) times daily.      [provider]  diphenhydrAMINE  (BENADRYL ) 25 MG tablet Take 1 tablet (25 mg total) by mouth every 6 (six) hours as needed. 05/09/18 07/24/19  Palumbo, April, MD    Family History History reviewed. No pertinent family history.  Social History Social History   Tobacco Use   Smoking status: Never   Smokeless tobacco: Never  Vaping Use   Vaping status: Never Used  Substance Use Topics   Alcohol use: Yes    Comment: socially   Drug use: Yes    Types: Marijuana     Allergies   Onion   Review of Systems Review of Systems   Physical Exam Triage Vital Signs ED Triage Vitals  Encounter Vitals Group     BP 02/01/24 1131 (!) 168/123     Girls Systolic BP Percentile --      Girls Diastolic BP Percentile --  Boys Systolic BP Percentile --      Boys Diastolic BP Percentile --      Pulse Rate 02/01/24 1131 84     Resp 02/01/24 1131 16     Temp 02/01/24 1131 98.4 F (36.9 C)     Temp Source 02/01/24 1131 Oral     SpO2 02/01/24 1131 94 %     Weight --      Height --      Head Circumference --      Peak Flow --      Pain Score 02/01/24 1133 0     Pain Loc --      Pain Education --      Exclude from Growth Chart --    No data found.  Updated Vital Signs BP (!) 168/123 (BP Location: Right Arm)   Pulse 84   Temp 98.4 F (36.9 C) (Oral)   Resp 16   SpO2 94%   Visual Acuity Right Eye Distance:   Left Eye Distance:   Bilateral Distance:    Right Eye Near:   Left Eye Near:    Bilateral Near:     Physical Exam Constitutional:       Appearance: Normal appearance. He is obese. He is not ill-appearing.  Cardiovascular:     Rate and Rhythm: Normal rate and regular rhythm.     Heart sounds: Normal heart sounds.  Pulmonary:     Effort: Pulmonary effort is normal.     Breath sounds: Normal breath sounds.  Feet:     Comments: Scaly plaques in moccasin type distribution of B feet Neurological:     Mental Status: He is alert.      UC Treatments / Results  Labs (all labs ordered are listed, but only abnormal results are displayed) Labs Reviewed  COMPREHENSIVE METABOLIC PANEL WITH GFR - Abnormal; Notable for the following components:      Result Value   Glucose, Bld 231 (*)    ALT 56 (*)    All other components within normal limits  RPR  HIV ANTIBODY (ROUTINE TESTING W REFLEX)  CBC  CYTOLOGY, (ORAL, ANAL, URETHRAL) ANCILLARY ONLY    EKG   Radiology No results found.  Procedures Procedures (including critical care time)  Medications Ordered in UC Medications - No data to display  Initial Impression / Assessment and Plan / UC Course  I have reviewed the triage vital signs and the nursing notes.  Pertinent labs & imaging results that were available during my care of the patient were reviewed by me and considered in my medical decision making (see chart for details).     He has elevated BP, hx HTN. No current CV sx. Will restart hydrochlorothiazide . He and I discussed given how high his BP is, he may need additional medicines to control his BP.   Will tx for exposure to trich  Rx albuterol  for prn use.   Will check basic labs. I'd like to make sure kidney function is normal given it seems like BP has not been treated for 3 years.   Addendum: blood glucose is high, this pt now has diabetes and needs f/u care and meds. I have attempted to call him x1. I'd like him to see primary care as soon as possible. I will try calling again.  Addendum: I have tried calling several times, and I have sent him a  mychart message and gotten no response. Urgent care RN will send him a form letter by mail  asking him to contact us  for important info  Final Clinical Impressions(s) / UC Diagnoses   Final diagnoses:  Essential hypertension  Possible exposure to STI  Tinea pedis of both feet  Asthma, unspecified asthma severity, unspecified whether complicated, unspecified whether persistent     Discharge Instructions      https://medicaid.ncdhhs.gov[place]apply  If you want in person help with applying for Medicaid, you can go to the 3rd floor of [address] [place] has a Nurse, Mental Health that can help you apply Monday-Friday during business hours.   You will get a call if any blood or other tests are positive or abnormal, you will not get a call if tests are negative or normal but you can check results in MyChart if you have a MyChart account.   To find primary care at [facility], go to [email or web address], click on primary care, click on new patients: schedule online, choose internal medicine or family medicine, and choose an appointment location and time that fits your schedule. You can choose Family Medicine and choose [facility]/[place] for an appointment with Virtual Primary Care (mostly on video but sometimes on site at a clinic in the [facility] community)        ED Prescriptions     Medication Sig Dispense Auth. Provider   miconazole (MICOTIN) 2 % cream Apply 1 Application topically 2 (two) times daily. 60 g Richad Jon HERO, NP   hydrochlorothiazide  (HYDRODIURIL ) 25 MG tablet Take 1 tablet (25 mg total) by mouth daily. 90 tablet Richad Jon HERO, NP   metroNIDAZOLE (FLAGYL) 500 MG tablet Take 4 tablets (2,000 mg total) by mouth once for 1 dose. 4 tablet Richad Jon HERO, NP   fluconazole (DIFLUCAN) 150 MG tablet Take 1 tablet (150 mg total) by mouth once a week. 4 tablet Richad Jon HERO, NP   albuterol  (VENTOLIN  HFA) 108 (90 Base) MCG/ACT inhaler Inhale 1-2 puffs into  the lungs every 6 (six) hours as needed for wheezing or shortness of breath. 6.7 g Richad Jon HERO, NP      PDMP not reviewed this encounter.   Richad Jon HERO, NP 03/02/24 0800

## 2024-02-01 NOTE — Discharge Instructions (Addendum)
 https://medicaid.ncdhhs.gov/apply  If you want in person help with applying for Medicaid, you can go to the 3rd floor of [address] [place] has a Nurse, mental health that can help you apply Monday-Friday during business hours.   You will get a call if any blood or other tests are positive or abnormal, you will not get a call if tests are negative or normal but you can check results in MyChart if you have a MyChart account.   To find primary care at [facility], go to [email or web address], click on primary care, click on new patients: schedule online, choose internal medicine or family medicine, and choose an appointment location and time that fits your schedule. You can choose Family Medicine and choose [facility]/[place] for an appointment with Virtual Primary Care (mostly on video but sometimes on site at a clinic in the [facility] community)

## 2024-02-01 NOTE — ED Triage Notes (Signed)
 Patient here today with c/o hypertension. Patient states that since being out of prison over a year ago, he has not been on anything for his blood pressure. Patient was taking hydrochlorothiazide . Patient does not have a PCP due to insurance issues. Denies chest pain.   Patient would also like to be tested for all Stds. Denies symptoms.

## 2024-02-02 ENCOUNTER — Ambulatory Visit (HOSPITAL_COMMUNITY): Payer: Self-pay

## 2024-02-02 ENCOUNTER — Other Ambulatory Visit (HOSPITAL_COMMUNITY): Payer: Self-pay

## 2024-02-02 LAB — CYTOLOGY, (ORAL, ANAL, URETHRAL) ANCILLARY ONLY
Chlamydia: NEGATIVE
Comment: NEGATIVE
Comment: NEGATIVE
Comment: NORMAL
Neisseria Gonorrhea: NEGATIVE
Trichomonas: NEGATIVE

## 2024-02-02 LAB — RPR: RPR Ser Ql: NONREACTIVE

## 2024-02-14 ENCOUNTER — Other Ambulatory Visit (HOSPITAL_COMMUNITY): Payer: Self-pay

## 2024-02-21 ENCOUNTER — Encounter: Payer: Self-pay | Admitting: Emergency Medicine

## 2024-03-01 ENCOUNTER — Telehealth (HOSPITAL_COMMUNITY): Payer: Self-pay

## 2024-03-01 NOTE — Telephone Encounter (Signed)
 Per RONAL Belt, NP, This pt is a new diabetic and he has no idea he has diabetes. He really needs follow up. I've tried calling him and sending him a message in MyChart. I'm fine with a generic letter if it can say something like "you have an important message in my chart about your test results! It's very important you read the message or contact us !"  Letter sent to address on file.

## 2024-04-20 ENCOUNTER — Encounter (HOSPITAL_BASED_OUTPATIENT_CLINIC_OR_DEPARTMENT_OTHER): Payer: Self-pay | Admitting: Emergency Medicine

## 2024-04-20 ENCOUNTER — Emergency Department (HOSPITAL_BASED_OUTPATIENT_CLINIC_OR_DEPARTMENT_OTHER)
Admission: EM | Admit: 2024-04-20 | Discharge: 2024-04-20 | Disposition: A | Discharge status: Home / Self Care | Attending: Emergency Medicine | Admitting: Emergency Medicine

## 2024-04-20 ENCOUNTER — Emergency Department (HOSPITAL_BASED_OUTPATIENT_CLINIC_OR_DEPARTMENT_OTHER)

## 2024-04-20 ENCOUNTER — Other Ambulatory Visit: Payer: Self-pay

## 2024-04-20 DIAGNOSIS — S20211A Contusion of right front wall of thorax, initial encounter: Secondary | ICD-10-CM | POA: Insufficient documentation

## 2024-04-20 DIAGNOSIS — S32019A Unspecified fracture of first lumbar vertebra, initial encounter for closed fracture: Secondary | ICD-10-CM | POA: Insufficient documentation

## 2024-04-20 DIAGNOSIS — I10 Essential (primary) hypertension: Secondary | ICD-10-CM | POA: Diagnosis not present

## 2024-04-20 DIAGNOSIS — Z7951 Long term (current) use of inhaled steroids: Secondary | ICD-10-CM | POA: Diagnosis not present

## 2024-04-20 DIAGNOSIS — S3992XA Unspecified injury of lower back, initial encounter: Secondary | ICD-10-CM | POA: Diagnosis present

## 2024-04-20 DIAGNOSIS — Z79899 Other long term (current) drug therapy: Secondary | ICD-10-CM | POA: Insufficient documentation

## 2024-04-20 DIAGNOSIS — J45909 Unspecified asthma, uncomplicated: Secondary | ICD-10-CM | POA: Diagnosis not present

## 2024-04-20 DIAGNOSIS — S32029A Unspecified fracture of second lumbar vertebra, initial encounter for closed fracture: Secondary | ICD-10-CM | POA: Diagnosis not present

## 2024-04-20 DIAGNOSIS — S32009A Unspecified fracture of unspecified lumbar vertebra, initial encounter for closed fracture: Secondary | ICD-10-CM

## 2024-04-20 DIAGNOSIS — Y92410 Unspecified street and highway as the place of occurrence of the external cause: Secondary | ICD-10-CM | POA: Insufficient documentation

## 2024-04-20 LAB — CBC WITH DIFFERENTIAL/PLATELET
Abs Immature Granulocytes: 0.03 K/uL (ref 0.00–0.07)
Basophils Absolute: 0.1 K/uL (ref 0.0–0.1)
Basophils Relative: 0 %
Eosinophils Absolute: 0.3 K/uL (ref 0.0–0.5)
Eosinophils Relative: 3 %
HCT: 47.3 % (ref 39.0–52.0)
Hemoglobin: 15.1 g/dL (ref 13.0–17.0)
Immature Granulocytes: 0 %
Lymphocytes Relative: 19 %
Lymphs Abs: 2.2 K/uL (ref 0.7–4.0)
MCH: 27.3 pg (ref 26.0–34.0)
MCHC: 31.9 g/dL (ref 30.0–36.0)
MCV: 85.4 fL (ref 80.0–100.0)
Monocytes Absolute: 0.6 K/uL (ref 0.1–1.0)
Monocytes Relative: 6 %
Neutro Abs: 8.1 K/uL — ABNORMAL HIGH (ref 1.7–7.7)
Neutrophils Relative %: 72 %
Platelets: 374 K/uL (ref 150–400)
RBC: 5.54 MIL/uL (ref 4.22–5.81)
RDW: 12.4 % (ref 11.5–15.5)
WBC: 11.3 K/uL — ABNORMAL HIGH (ref 4.0–10.5)
nRBC: 0 % (ref 0.0–0.2)

## 2024-04-20 LAB — COMPREHENSIVE METABOLIC PANEL WITH GFR
ALT: 51 U/L — ABNORMAL HIGH (ref 0–44)
AST: 27 U/L (ref 15–41)
Albumin: 4.4 g/dL (ref 3.5–5.0)
Alkaline Phosphatase: 85 U/L (ref 38–126)
Anion gap: 10 (ref 5–15)
BUN: 8 mg/dL (ref 6–20)
CO2: 30 mmol/L (ref 22–32)
Calcium: 9.9 mg/dL (ref 8.9–10.3)
Chloride: 97 mmol/L — ABNORMAL LOW (ref 98–111)
Creatinine, Ser: 0.97 mg/dL (ref 0.61–1.24)
GFR, Estimated: >60 mL/min
Glucose, Bld: 192 mg/dL — ABNORMAL HIGH (ref 70–99)
Potassium: 4.3 mmol/L (ref 3.5–5.1)
Sodium: 137 mmol/L (ref 135–145)
Total Bilirubin: 0.9 mg/dL (ref 0.0–1.2)
Total Protein: 7.8 g/dL (ref 6.5–8.1)

## 2024-04-20 LAB — LIPASE, BLOOD: Lipase: 30 U/L (ref 11–51)

## 2024-04-20 MED ORDER — IOHEXOL 300 MG/ML  SOLN
100.0000 mL | Freq: Once | INTRAMUSCULAR | Status: AC | PRN
  Administered 2024-04-20: 100 mL via INTRAVENOUS

## 2024-04-20 MED ORDER — CYCLOBENZAPRINE HCL 10 MG PO TABS: 10.0000 mg | ORAL_TABLET | Freq: Three times a day (TID) | ORAL | 0 refills | Status: AC | PRN

## 2024-04-20 MED ORDER — IBUPROFEN 600 MG PO TABS: 600.0000 mg | ORAL_TABLET | Freq: Four times a day (QID) | ORAL | 0 refills | Status: AC | PRN

## 2024-04-20 MED ORDER — HYDROCODONE-ACETAMINOPHEN 5-325 MG PO TABS: 1.0000 | ORAL_TABLET | Freq: Four times a day (QID) | ORAL | 0 refills | Status: AC | PRN
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
